# Patient Record
Sex: Female | Born: 1985 | Race: White | Hispanic: No | Marital: Single | Smoking: Never smoker
Health system: Southern US, Community
[De-identification: ages and names within clinical notes are randomized; demographics above are authoritative.]

---

## 2004-01-14 ENCOUNTER — Emergency Department: Payer: Self-pay | Admitting: Emergency Medicine

## 2005-09-09 ENCOUNTER — Emergency Department: Payer: Self-pay | Admitting: Emergency Medicine

## 2012-07-14 ENCOUNTER — Emergency Department: Payer: Self-pay

## 2014-06-07 ENCOUNTER — Emergency Department
Admit: 2014-06-07 | Disposition: A | Discharge status: Home / Self Care | Payer: Self-pay | Admitting: Emergency Medicine

## 2014-06-08 LAB — COMPREHENSIVE METABOLIC PANEL
ALK PHOS: 43 U/L
ALT: 12 U/L — AB
Albumin: 4 g/dL
Anion Gap: 7 (ref 7–16)
BUN: 9 mg/dL
Bilirubin,Total: 0.5 mg/dL
CALCIUM: 8.9 mg/dL
CHLORIDE: 106 mmol/L
CO2: 27 mmol/L
Creatinine: 0.62 mg/dL
EGFR (African American): >60
GLUCOSE: 91 mg/dL
POTASSIUM: 3.4 mmol/L — AB
SGOT(AST): 19 U/L
SODIUM: 140 mmol/L
Total Protein: 6.9 g/dL

## 2014-06-08 LAB — CBC WITH DIFFERENTIAL/PLATELET
BASOS ABS: 0.1 10*3/uL (ref 0.0–0.1)
Basophil %: 0.8 %
Eosinophil #: 0.1 10*3/uL (ref 0.0–0.7)
Eosinophil %: 1.2 %
HCT: 39.2 % (ref 35.0–47.0)
HGB: 13 g/dL (ref 12.0–16.0)
LYMPHS ABS: 3.4 10*3/uL (ref 1.0–3.6)
Lymphocyte %: 39.9 %
MCH: 30.3 pg (ref 26.0–34.0)
MCHC: 33.2 g/dL (ref 32.0–36.0)
MCV: 91 fL (ref 80–100)
Monocyte #: 0.5 x10 3/mm (ref 0.2–0.9)
Monocyte %: 6.1 %
NEUTROS ABS: 4.4 10*3/uL (ref 1.4–6.5)
Neutrophil %: 52 %
PLATELETS: 273 10*3/uL (ref 150–440)
RBC: 4.3 10*6/uL (ref 3.80–5.20)
RDW: 12.3 % (ref 11.5–14.5)
WBC: 8.5 10*3/uL (ref 3.6–11.0)

## 2014-06-08 LAB — URINALYSIS, COMPLETE
Bacteria: NONE SEEN
Bilirubin,UR: NEGATIVE
Blood: NEGATIVE
Glucose,UR: NEGATIVE mg/dL (ref 0–75)
Ketone: NEGATIVE
Leukocyte Esterase: NEGATIVE
Nitrite: NEGATIVE
PH: 5 (ref 4.5–8.0)
PROTEIN: NEGATIVE
Specific Gravity: 1.026 (ref 1.003–1.030)
WBC UR: 1 /HPF (ref 0–5)

## 2014-06-08 LAB — TSH: THYROID STIMULATING HORM: 0.78 u[IU]/mL

## 2014-06-08 LAB — LIPASE, BLOOD: LIPASE: 64 U/L — AB

## 2014-07-06 ENCOUNTER — Other Ambulatory Visit: Payer: Self-pay | Admitting: Internal Medicine

## 2014-07-06 DIAGNOSIS — D352 Benign neoplasm of pituitary gland: Secondary | ICD-10-CM

## 2014-07-08 ENCOUNTER — Ambulatory Visit
Admission: RE | Admit: 2014-07-08 | Discharge: 2014-07-08 | Disposition: A | Discharge status: Home / Self Care | Payer: BLUE CROSS/BLUE SHIELD | Source: Ambulatory Visit | Attending: Internal Medicine | Admitting: Internal Medicine

## 2014-07-08 DIAGNOSIS — D352 Benign neoplasm of pituitary gland: Secondary | ICD-10-CM | POA: Insufficient documentation

## 2014-07-08 MED ORDER — GADOBENATE DIMEGLUMINE 529 MG/ML IV SOLN
5.0000 mL | Freq: Once | INTRAVENOUS | Status: AC | PRN
  Administered 2014-07-08: 5 mL via INTRAVENOUS

## 2014-07-13 ENCOUNTER — Ambulatory Visit: Payer: Self-pay | Admitting: Endocrinology

## 2014-07-14 ENCOUNTER — Ambulatory Visit: Payer: Self-pay

## 2015-09-07 ENCOUNTER — Emergency Department
Admission: EM | Admit: 2015-09-07 | Discharge: 2015-09-07 | Disposition: A | Discharge status: Home / Self Care | Payer: Commercial Managed Care - PPO | Attending: Emergency Medicine | Admitting: Emergency Medicine

## 2015-09-07 ENCOUNTER — Encounter: Payer: Self-pay | Admitting: Emergency Medicine

## 2015-09-07 ENCOUNTER — Emergency Department: Payer: Commercial Managed Care - PPO

## 2015-09-07 DIAGNOSIS — Y9389 Activity, other specified: Secondary | ICD-10-CM | POA: Diagnosis not present

## 2015-09-07 DIAGNOSIS — Y92009 Unspecified place in unspecified non-institutional (private) residence as the place of occurrence of the external cause: Secondary | ICD-10-CM | POA: Insufficient documentation

## 2015-09-07 DIAGNOSIS — S299XXA Unspecified injury of thorax, initial encounter: Secondary | ICD-10-CM | POA: Diagnosis present

## 2015-09-07 DIAGNOSIS — Y999 Unspecified external cause status: Secondary | ICD-10-CM | POA: Insufficient documentation

## 2015-09-07 DIAGNOSIS — S20212A Contusion of left front wall of thorax, initial encounter: Secondary | ICD-10-CM | POA: Diagnosis not present

## 2015-09-07 MED ORDER — MELOXICAM 15 MG PO TABS: 15.0000 mg | ORAL_TABLET | Freq: Every day | ORAL | Status: AC
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
## 2015-09-07 NOTE — Discharge Instructions (Signed)
Blunt Chest Trauma Blunt chest trauma is an injury caused by a blow to the chest. These chest injuries can be very painful. Blunt chest trauma often results in bruised or broken (fractured) ribs. Most cases of bruised and fractured ribs from blunt chest traumas get better after 1 to 3 weeks of rest and pain medicine. Often, the soft tissue in the chest wall is also injured, causing pain and bruising. Internal organs, such as the heart and lungs, may also be injured. Blunt chest trauma can lead to serious medical problems. This injury requires immediate medical care. CAUSES   Motor vehicle collisions.  Falls.  Physical violence.  Sports injuries. SYMPTOMS   Chest pain. The pain may be worse when you move or breathe deeply.  Shortness of breath.  Lightheadedness.  Bruising.  Tenderness.  Swelling. DIAGNOSIS  Your caregiver will do a physical exam. X-rays may be taken to look for fractures. However, minor rib fractures may not show up on X-rays until a few days after the injury. If a more serious injury is suspected, further imaging tests may be done. This may include ultrasounds, computed tomography (CT) scans, or magnetic resonance imaging (MRI). TREATMENT  Treatment depends on the severity of your injury. Your caregiver may prescribe pain medicines and deep breathing exercises. HOME CARE INSTRUCTIONS  Limit your activities until you can move around without much pain.  Do not do any strenuous work until your injury is healed.  Put ice on the injured area.  Put ice in a plastic bag.  Place a towel between your skin and the bag.  Leave the ice on for 15-20 minutes, 03-04 times a day.  You may wear a rib belt as directed by your caregiver to reduce pain.  Practice deep breathing as directed by your caregiver to keep your lungs clear.  Only take over-the-counter or prescription medicines for pain, fever, or discomfort as directed by your caregiver. SEEK IMMEDIATE MEDICAL  CARE IF:   You have increasing pain or shortness of breath.  You cough up blood.  You have nausea, vomiting, or abdominal pain.  You have a fever.  You feel dizzy, weak, or you faint. MAKE SURE YOU:  Understand these instructions.  Will watch your condition.  Will get help right away if you are not doing well or get worse.   This information is not intended to replace advice given to you by your health care provider. Make sure you discuss any questions you have with your health care provider.   Document Released: 03/27/2004 Document Revised: 03/10/2014 Document Reviewed: 08/16/2014 Elsevier Interactive Patient Education 2016 [place].  Rib Contusion A rib contusion is a deep bruise on your rib area. Contusions are the result of a blunt trauma that causes bleeding and injury to the tissues under the skin. A rib contusion may involve bruising of the ribs and of the skin and muscles in the area. The skin overlying the contusion may turn blue, purple, or yellow. Minor injuries will give you a painless contusion, but more severe contusions may stay painful and swollen for a few weeks. CAUSES  A contusion is usually caused by a blow, trauma, or direct force to an area of the body. This often occurs while playing contact sports. SYMPTOMS  Swelling and redness of the injured area.  Discoloration of the injured area.  Tenderness and soreness of the injured area.  Pain with or without movement. DIAGNOSIS  The diagnosis can be made by taking a medical history and  performing a physical exam. An X-ray, CT scan, or MRI may be needed to determine if there were any associated injuries, such as broken bones (fractures) or internal injuries. TREATMENT  Often, the best treatment for a rib contusion is rest. Icing or applying cold compresses to the injured area may help reduce swelling and inflammation. Deep breathing exercises may be recommended to reduce the risk of partial lung collapse  and pneumonia. Over-the-counter or prescription medicines may also be recommended for pain control. HOME CARE INSTRUCTIONS   Apply ice to the injured area:  Put ice in a plastic bag.  Place a towel between your skin and the bag.  Leave the ice on for 20 minutes, 2-3 times per day.  Take medicines only as directed by your health care provider.  Rest the injured area. Avoid strenuous activity and any activities or movements that cause pain. Be careful during activities and avoid bumping the injured area.  Perform deep-breathing exercises as directed by your health care provider.  Do not lift anything that is heavier than 5 lb (2.3 kg) until your health care provider approves.  Do not use any tobacco products, including cigarettes, chewing tobacco, or electronic cigarettes. If you need help quitting, ask your health care provider. SEEK MEDICAL CARE IF:   You have increased bruising or swelling.  You have pain that is not controlled with treatment.  You have a fever. SEEK IMMEDIATE MEDICAL CARE IF:   You have difficulty breathing or shortness of breath.  You develop a continual cough, or you cough up thick or bloody sputum.  You feel sick to your stomach (nauseous), you throw up (vomit), or you have abdominal pain.   This information is not intended to replace advice given to you by your health care provider. Make sure you discuss any questions you have with your health care provider.   Document Released: 11/12/2000 Document Revised: 03/10/2014 Document Reviewed: 11/29/2013 Elsevier Interactive Patient Education [employer].
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
## 2015-09-08 ENCOUNTER — Emergency Department: Payer: Commercial Managed Care - PPO

## 2015-09-08 ENCOUNTER — Encounter: Payer: Self-pay | Admitting: Emergency Medicine

## 2015-09-08 ENCOUNTER — Emergency Department
Admission: EM | Admit: 2015-09-08 | Discharge: 2015-09-08 | Disposition: A | Discharge status: Home / Self Care | Payer: Commercial Managed Care - PPO | Attending: Emergency Medicine | Admitting: Emergency Medicine

## 2015-09-08 DIAGNOSIS — Y929 Unspecified place or not applicable: Secondary | ICD-10-CM | POA: Insufficient documentation

## 2015-09-08 DIAGNOSIS — R0781 Pleurodynia: Secondary | ICD-10-CM | POA: Insufficient documentation

## 2015-09-08 DIAGNOSIS — Y999 Unspecified external cause status: Secondary | ICD-10-CM | POA: Diagnosis not present

## 2015-09-08 DIAGNOSIS — Z791 Long term (current) use of non-steroidal anti-inflammatories (NSAID): Secondary | ICD-10-CM | POA: Insufficient documentation

## 2015-09-08 DIAGNOSIS — Y9389 Activity, other specified: Secondary | ICD-10-CM | POA: Insufficient documentation

## 2015-09-08 DIAGNOSIS — S8002XA Contusion of left knee, initial encounter: Secondary | ICD-10-CM | POA: Diagnosis not present

## 2015-09-08 DIAGNOSIS — S59912A Unspecified injury of left forearm, initial encounter: Secondary | ICD-10-CM | POA: Diagnosis present

## 2015-09-08 DIAGNOSIS — S5012XA Contusion of left forearm, initial encounter: Secondary | ICD-10-CM | POA: Diagnosis not present

## 2015-09-08 MED ORDER — TRAMADOL HCL 50 MG PO TABS: 50.0000 mg | ORAL_TABLET | Freq: Four times a day (QID) | ORAL | Status: AC | PRN

## 2015-09-08 NOTE — Discharge Instructions (Signed)
Contusion A contusion is a deep bruise. Contusions happen when an injury causes bleeding under the skin. Symptoms of bruising include pain, swelling, and discolored skin. The skin may turn blue, purple, or yellow. HOME CARE   Rest the injured area.  If told, put ice on the injured area.  Put ice in a plastic bag.  Place a towel between your skin and the bag.  Leave the ice on for 20 minutes, 2-3 times per day.  If told, put light pressure (compression) on the injured area using an elastic bandage. Make sure the bandage is not too tight. Remove it and put it back on as told by your doctor.  If possible, raise (elevate) the injured area above the level of your heart while you are sitting or lying down.  Take over-the-counter and prescription medicines only as told by your doctor. GET HELP IF:  Your symptoms do not get better after several days of treatment.  Your symptoms get worse.  You have trouble moving the injured area. GET HELP RIGHT AWAY IF:   You have very bad pain.  You have a loss of feeling (numbness) in a hand or foot.  Your hand or foot turns pale or cold.   This information is not intended to replace advice given to you by your health care provider. Make sure you discuss any questions you have with your health care provider.   Document Released: 08/06/2007 Document Revised: 11/08/2014 Document Reviewed: 07/05/2014 Elsevier Interactive Patient Education 2016 [employer]vier [employer]  Cryotherapy Cryotherapy is when you put ice on your injury. Ice helps lessen pain and puffiness (swelling) after an injury. Ice works the best when you start using it in the first 24 to 48 hours after an injury. HOME CARE  Put a dry or damp towel between the ice pack and your skin.  You may press gently on the ice pack.  Leave the ice on for no more than 10 to 20 minutes at a time.  Check your skin after 5 minutes to make sure your skin is okay.  Rest at least 20 minutes between ice  pack uses.  Stop using ice when your skin loses feeling (numbness).  Do not use ice on someone who cannot tell you when it hurts. This includes small children and people with memory problems (dementia). GET HELP RIGHT AWAY IF:  You have white spots on your skin.  Your skin turns blue or pale.  Your skin feels waxy or hard.  Your puffiness gets worse. MAKE SURE YOU:   Understand these instructions.  Will watch your condition.  Will get help right away if you are not doing well or get worse.   This information is not intended to replace advice given to you by your health care provider. Make sure you discuss any questions you have with your health care provider.   Document Released: 08/06/2007 Document Revised: 05/12/2011 Document Reviewed: 10/10/2010 Elsevier Interactive Patient Education 2016 [place] with your primary care doctor if any continued problems. Continues ice to all bruises as needed for comfort. Continue meloxicam daily with food. Tramadol 50 mg one every 4 hours as needed for severe pain.
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
## 2018-03-05 ENCOUNTER — Other Ambulatory Visit: Payer: Self-pay
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
